# Patient Record
Sex: Female | Born: 1950 | Race: Black or African American | Hispanic: No | Marital: Single
Health system: Southern US, Community
[De-identification: ages and names within clinical notes are randomized; demographics above are authoritative.]

---

## 2014-12-20 ENCOUNTER — Other Ambulatory Visit (HOSPITAL_COMMUNITY): Payer: Self-pay

## 2014-12-20 ENCOUNTER — Inpatient Hospital Stay
Admission: AD | Admit: 2014-12-20 | Discharge: 2015-01-27 | Disposition: E | Payer: Self-pay | Source: Ambulatory Visit | Attending: Internal Medicine | Admitting: Internal Medicine

## 2014-12-20 DIAGNOSIS — J189 Pneumonia, unspecified organism: Secondary | ICD-10-CM

## 2014-12-20 DIAGNOSIS — Z4659 Encounter for fitting and adjustment of other gastrointestinal appliance and device: Secondary | ICD-10-CM

## 2014-12-20 DIAGNOSIS — K922 Gastrointestinal hemorrhage, unspecified: Secondary | ICD-10-CM

## 2014-12-20 DIAGNOSIS — R609 Edema, unspecified: Secondary | ICD-10-CM

## 2014-12-20 DIAGNOSIS — I509 Heart failure, unspecified: Secondary | ICD-10-CM

## 2014-12-20 DIAGNOSIS — N179 Acute kidney failure, unspecified: Secondary | ICD-10-CM

## 2014-12-20 DIAGNOSIS — N170 Acute kidney failure with tubular necrosis: Secondary | ICD-10-CM

## 2014-12-20 DIAGNOSIS — A419 Sepsis, unspecified organism: Secondary | ICD-10-CM

## 2014-12-21 LAB — CBC WITH DIFFERENTIAL/PLATELET
BASOS ABS: 0 10*3/uL (ref 0.0–0.1)
BASOS PCT: 0 %
EOS PCT: 0 %
Eosinophils Absolute: 0 10*3/uL (ref 0.0–0.7)
HEMATOCRIT: 27.4 % — AB (ref 36.0–46.0)
Hemoglobin: 9.7 g/dL — ABNORMAL LOW (ref 12.0–15.0)
Lymphocytes Relative: 25 %
Lymphs Abs: 1.5 10*3/uL (ref 0.7–4.0)
MCH: 31.1 pg (ref 26.0–34.0)
MCHC: 35.4 g/dL (ref 30.0–36.0)
MCV: 87.8 fL (ref 78.0–100.0)
MONO ABS: 0.3 10*3/uL (ref 0.1–1.0)
MONOS PCT: 4 %
NEUTROS ABS: 4.4 10*3/uL (ref 1.7–7.7)
Neutrophils Relative %: 71 %
PLATELETS: 34 10*3/uL — AB (ref 150–400)
RBC: 3.12 MIL/uL — ABNORMAL LOW (ref 3.87–5.11)
RDW: 18 % — AB (ref 11.5–15.5)
WBC: 6.2 10*3/uL (ref 4.0–10.5)

## 2014-12-21 LAB — COMPREHENSIVE METABOLIC PANEL
ALBUMIN: 2.7 g/dL — AB (ref 3.5–5.0)
ALK PHOS: 61 U/L (ref 38–126)
ALT: 40 U/L (ref 14–54)
ANION GAP: 5 (ref 5–15)
AST: 60 U/L — ABNORMAL HIGH (ref 15–41)
BUN: 126 mg/dL — ABNORMAL HIGH (ref 6–20)
CALCIUM: 8.3 mg/dL — AB (ref 8.9–10.3)
CO2: 25 mmol/L (ref 22–32)
Chloride: 107 mmol/L (ref 101–111)
Creatinine, Ser: 2.74 mg/dL — ABNORMAL HIGH (ref 0.44–1.00)
GFR calc Af Amer: 20 mL/min — ABNORMAL LOW (ref >60)
GFR calc non Af Amer: 17 mL/min — ABNORMAL LOW (ref >60)
GLUCOSE: 90 mg/dL (ref 65–99)
POTASSIUM: 4.6 mmol/L (ref 3.5–5.1)
Sodium: 137 mmol/L (ref 135–145)
Total Bilirubin: 2.3 mg/dL — ABNORMAL HIGH (ref 0.3–1.2)
Total Protein: 4.9 g/dL — ABNORMAL LOW (ref 6.5–8.1)

## 2014-12-21 LAB — URINALYSIS, ROUTINE W REFLEX MICROSCOPIC
BILIRUBIN URINE: NEGATIVE
GLUCOSE, UA: NEGATIVE mg/dL
Ketones, ur: 15 mg/dL — AB
Nitrite: NEGATIVE
Protein, ur: >300 mg/dL — AB
SPECIFIC GRAVITY, URINE: 1.019 (ref 1.005–1.030)
UROBILINOGEN UA: 0.2 mg/dL (ref 0.0–1.0)
pH: 5 (ref 5.0–8.0)

## 2014-12-21 LAB — URINE MICROSCOPIC-ADD ON

## 2014-12-21 LAB — TSH: TSH: 2.885 u[IU]/mL (ref 0.350–4.500)

## 2014-12-21 LAB — PROTIME-INR
INR: 1.87 — AB (ref 0.00–1.49)
PROTHROMBIN TIME: 21.4 s — AB (ref 11.6–15.2)

## 2014-12-21 LAB — T4, FREE: FREE T4: 1.28 ng/dL — AB (ref 0.61–1.12)

## 2014-12-21 LAB — VANCOMYCIN, TROUGH: VANCOMYCIN TR: 26 ug/mL — AB (ref 10.0–20.0)

## 2014-12-21 LAB — D-DIMER, QUANTITATIVE (NOT AT ARMC): D DIMER QUANT: 13.28 ug{FEU}/mL — AB (ref 0.00–0.48)

## 2014-12-21 LAB — PHOSPHORUS: Phosphorus: 4.8 mg/dL — ABNORMAL HIGH (ref 2.5–4.6)

## 2014-12-21 LAB — MAGNESIUM: Magnesium: 2.4 mg/dL (ref 1.7–2.4)

## 2014-12-21 LAB — BRAIN NATRIURETIC PEPTIDE: B Natriuretic Peptide: 525.9 pg/mL — ABNORMAL HIGH (ref 0.0–100.0)

## 2014-12-22 LAB — BASIC METABOLIC PANEL
Anion gap: 10 (ref 5–15)
BUN: 136 mg/dL — AB (ref 6–20)
CHLORIDE: 101 mmol/L (ref 101–111)
CO2: 25 mmol/L (ref 22–32)
CREATININE: 3.26 mg/dL — AB (ref 0.44–1.00)
Calcium: 8.1 mg/dL — ABNORMAL LOW (ref 8.9–10.3)
GFR calc non Af Amer: 14 mL/min — ABNORMAL LOW (ref >60)
GFR, EST AFRICAN AMERICAN: 16 mL/min — AB (ref >60)
Glucose, Bld: 173 mg/dL — ABNORMAL HIGH (ref 65–99)
POTASSIUM: 4.8 mmol/L (ref 3.5–5.1)
SODIUM: 136 mmol/L (ref 135–145)

## 2014-12-22 LAB — CBC WITH DIFFERENTIAL/PLATELET
BASOS PCT: 0 %
Basophils Absolute: 0 10*3/uL (ref 0.0–0.1)
EOS PCT: 0 %
Eosinophils Absolute: 0 10*3/uL (ref 0.0–0.7)
HEMATOCRIT: 26 % — AB (ref 36.0–46.0)
HEMOGLOBIN: 9 g/dL — AB (ref 12.0–15.0)
LYMPHS PCT: 27 %
Lymphs Abs: 1.3 10*3/uL (ref 0.7–4.0)
MCH: 31.6 pg (ref 26.0–34.0)
MCHC: 34.6 g/dL (ref 30.0–36.0)
MCV: 91.2 fL (ref 78.0–100.0)
MONO ABS: 0.2 10*3/uL (ref 0.1–1.0)
Monocytes Relative: 4 %
NEUTROS PCT: 69 %
Neutro Abs: 3.4 10*3/uL (ref 1.7–7.7)
Platelets: 27 10*3/uL — CL (ref 150–400)
RBC: 2.85 MIL/uL — AB (ref 3.87–5.11)
RDW: 18.3 % — ABNORMAL HIGH (ref 11.5–15.5)
WBC: 4.9 10*3/uL (ref 4.0–10.5)

## 2014-12-22 LAB — URINE CULTURE: CULTURE: NO GROWTH

## 2014-12-22 LAB — VANCOMYCIN, TROUGH: Vancomycin Tr: 19 ug/mL (ref 10.0–20.0)

## 2014-12-22 LAB — HEMOGLOBIN A1C
HEMOGLOBIN A1C: 8.3 % — AB (ref 4.8–5.6)
MEAN PLASMA GLUCOSE: 192 mg/dL

## 2014-12-22 LAB — MAGNESIUM: MAGNESIUM: 2.6 mg/dL — AB (ref 1.7–2.4)

## 2014-12-22 LAB — PHOSPHORUS: PHOSPHORUS: 6.3 mg/dL — AB (ref 2.5–4.6)

## 2014-12-22 LAB — PROCALCITONIN: Procalcitonin: 0.49 ng/mL

## 2014-12-22 NOTE — Consult Note (Signed)
Date: 12/22/2014                  Patient Name:  Kristina Moon  MRN: [id number]  DOB: 17-Jul-1950  Age / Sex: 64 y.o., female         PCP: No primary care provider on file.                 [facility]ing [facility]                 Reason for Consult: Acute renal failure            History of Present Illness: Patient is a 64 y.o. 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  Patient was admitted to [facility] emergency room on October 13 after she was found to have hypoglycemia midmorning. Her blood sugar was 23. Oral intake in the last few days had been poor. She was found to have low-grade fever of 100.2. Workup suggested that she has UTI therefore she was admitted At the day of admission, her creatinine was 2.27, BUN 110, glucose 343, albumin 3.2, potassium 4.4 UGI from the Escherichia coli was treated with Levaquin. She had staph epidermidis bacteremia 4 out of 4 bottles. It was sensitive to Levaquin. Her discharge summary, she had a CT without contrast of the chest which showed developing right lower lobe pneumonia so vancomycin and Zosyn was started for hospital-acquired pneumonia. VQ scan showed low probability of PE. Lower extremity Dopplers were negative for DVT.  She was seen by nephrology service and was started on Lasix Ticlid 200 cardiorenal syndrome and congestive heart failure. Cirrhosis is considered to be secondary to [place].   Medications: Outpatient medications: No prescriptions prior to admission    Current medications: No current facility-administered  medications for this encounter.   Zosyn 2.25 g IV every 6 hours Vancomycin 500 mg IV daily Aspirin 81 mg daily Lipitor 20 mg each bedtime Carvedilol 25 mg twice a day Plavix 75 mg daily Pepcid 20 mg twice a day Iron sulfate 325 mg twice a day Hydralazine 10 mg twice a day Isosorbide nitrate 10 mg twice a day Lantus 65 mg twice a day Levofloxacin 250 mg every 48 hours Prednisone 60 mg daily Probiotic Vitamin B12 Vitamin D 3 2000 daily   Allergies: Allergies not on file  propoxyphene  Past Medical History: No past medical history on file. As noted  Past Surgical History: No past surgical history on file.   Family History: No family history on file. Colon cancer in aunt Congestive heart failure and coronary disease in mother  Social History: Social History   Social History  . Marital Status: Single    Spouse Name: N/A  . Number of Children: N/A  . Years of Education: N/A   Occupational History  . Not on file.   Social History Main Topics  . Smoking status: Not on file  . Smokeless tobacco: Not on file  . Alcohol Use: Not on file  . Drug Use: Not on file  . Sexual Activity: Not on file   Other Topics Concern  . Not on file   Social History Narrative  . No narrative on file  Review of Systems: Gen: generalized weakness HEENT: no complaints CV: history of congestive heart failure, currently has significant edema Resp: denies any complaint ZO:XWRUEAVWGI:appetite is good GU : currently has a cold MS: she is able to walk at home Derm:  no complaints Psych:normal Heme: takes prednisone for autoimmune hemolytic anemia Neuro: no complaints Endocrine: diabetic kidney disease  Vital Signs: There were no vitals taken for this visit.  No intake or output data in the 24 hours ending 12/22/14 1400  Weight trends: There were no vitals filed for this visit.  Physical Exam: General:  obese lady, laying in the bed, in no distress  HEENT Anicteric, moist  mucous membranes, pale conjunctiva  Neck:  supple, no masses  Lungs: Normal respiratory effort, clear to auscultation  Heart::  regular rhythm, no rubs or gallops  Abdomen: Soft, nontender, nondistended  Extremities:  massive lower extremity edema upto lower abdomen  Neurologic: , oriented, alert, speech a little hard to understand  Skin: Edema blisters  Access:   Foley: present       Lab results: Basic Metabolic Panel:  Recent Labs Lab 12/21/14 0800 12/22/14 0550  NA 137 136  K 4.6 4.8  CL 107 101  CO2 25 25  GLUCOSE 90 173*  BUN 126* 136*  CREATININE 2.74* 3.26*  CALCIUM 8.3* 8.1*  MG 2.4 2.6*  PHOS 4.8* 6.3*    Liver Function Tests:  Recent Labs Lab 12/21/14 0800  AST 60*  ALT 40  ALKPHOS 61  BILITOT 2.3*  PROT 4.9*  ALBUMIN 2.7*   No results for input(s): LIPASE, AMYLASE in the last 168 hours. No results for input(s): AMMONIA in the last 168 hours.  CBC:  Recent Labs Lab 12/21/14 0800 12/22/14 0550  WBC 6.2 4.9  NEUTROABS 4.4 3.4  HGB 9.7* 9.0*  HCT 27.4* 26.0*  MCV 87.8 91.2  PLT 34* 27*    Cardiac Enzymes: No results for input(s): CKTOTAL, TROPONINI in the last 168 hours.  BNP: Invalid input(s): POCBNP  CBG: No results for input(s): GLUCAP in the last 168 hours.  Microbiology: Recent Results (from the past 720 hour(s))  Culture, Urine     Status: None   Collection Time: 12/21/14  8:08 AM  Result Value Ref Range Status   Specimen Description URINE, RANDOM  Final   Special Requests NONE  Final   Culture NO GROWTH 1 DAY  Final   Report Status 12/22/2014 FINAL  Final     Coagulation Studies:  Recent Labs  12/21/14 0800  LABPROT 21.4*  INR 1.87*    Urinalysis:  Recent Labs  12/21/14 0808  COLORURINE YELLOW  LABSPEC 1.019  PHURINE 5.0  GLUCOSEU NEGATIVE  HGBUR LARGE*  BILIRUBINUR NEGATIVE  KETONESUR 15*  PROTEINUR >300*  UROBILINOGEN 0.2  NITRITE NEGATIVE  LEUKOCYTESUR MODERATE*      Imaging: Dg Chest Port 1  View  12/17/2014  CLINICAL DATA:  Pneumonia EXAM: PORTABLE CHEST 1 VIEW COMPARISON:  None. FINDINGS: Borderline cardiomegaly. No acute infiltrate or pleural effusion. No pulmonary edema. Right arm PICC line with tip in distal SVC. IMPRESSION: No active disease. Electronically Signed   By: Natasha MeadLiviu  Pop M.D.   On: 12/13/2014 20:05      Assessment & Plan: Patient is a 64 y.o. 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   1. Acute renal failure, likely ATN secondary to concurrent infection. Baseline Creatinine 0.64 in May of 2016 according to care everywhere 2. Diabetes with complications, Nephropathy. Poorly controlled. Hemoglobin A1c 8.5% in May.  3. Chronic systolic congestive heart failure with acute exacerbation 4. 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 5. Hemolytic anemia- requiring use of prednisone. Not clear when it was started 6. Hematuria and proteinuria 7. Urinary tract infection 8. Lower extremity edema   Patient has worsening edema and fluid retention likely secondary to chronic use of prednisone. She also has underlying liver cirrhosis. Her BUN and creatinine are getting worse. BUN is high likely secondary to again use of prednisone Patient is also noted to be Plavix. She is not clear about the reason that she has to take Plavix. She does not have any cardiac stents etc.    Plan: We will start her on low dose Lasix drip along with IV albumin supplementation Consider decreasing prednisone Reevaluate necessity of using Plavix as patient may require temporary dialysis catheter placement in the future if she does not  respond to medical treatment Patient will need renal U/S

## 2014-12-23 ENCOUNTER — Other Ambulatory Visit (HOSPITAL_COMMUNITY): Payer: Self-pay

## 2014-12-23 LAB — CBC WITH DIFFERENTIAL/PLATELET
BASOS ABS: 0 10*3/uL (ref 0.0–0.1)
Basophils Relative: 0 %
EOS PCT: 0 %
Eosinophils Absolute: 0 10*3/uL (ref 0.0–0.7)
HEMATOCRIT: 26.6 % — AB (ref 36.0–46.0)
Hemoglobin: 9.3 g/dL — ABNORMAL LOW (ref 12.0–15.0)
LYMPHS ABS: 1.5 10*3/uL (ref 0.7–4.0)
LYMPHS PCT: 22 %
MCH: 31.7 pg (ref 26.0–34.0)
MCHC: 35 g/dL (ref 30.0–36.0)
MCV: 90.8 fL (ref 78.0–100.0)
MONO ABS: 0.3 10*3/uL (ref 0.1–1.0)
Monocytes Relative: 4 %
NEUTROS ABS: 5.1 10*3/uL (ref 1.7–7.7)
Neutrophils Relative %: 74 %
PLATELETS: 32 10*3/uL — AB (ref 150–400)
RBC: 2.93 MIL/uL — AB (ref 3.87–5.11)
RDW: 18.2 % — ABNORMAL HIGH (ref 11.5–15.5)
WBC: 6.8 10*3/uL (ref 4.0–10.5)

## 2014-12-23 LAB — MAGNESIUM: MAGNESIUM: 2.7 mg/dL — AB (ref 1.7–2.4)

## 2014-12-23 LAB — RENAL FUNCTION PANEL
Albumin: 2.7 g/dL — ABNORMAL LOW (ref 3.5–5.0)
Anion gap: 10 (ref 5–15)
BUN: 148 mg/dL — ABNORMAL HIGH (ref 6–20)
CHLORIDE: 102 mmol/L (ref 101–111)
CO2: 22 mmol/L (ref 22–32)
Calcium: 8.4 mg/dL — ABNORMAL LOW (ref 8.9–10.3)
Creatinine, Ser: 3.72 mg/dL — ABNORMAL HIGH (ref 0.44–1.00)
GFR, EST AFRICAN AMERICAN: 14 mL/min — AB (ref >60)
GFR, EST NON AFRICAN AMERICAN: 12 mL/min — AB (ref >60)
Glucose, Bld: 69 mg/dL (ref 65–99)
PHOSPHORUS: 6.5 mg/dL — AB (ref 2.5–4.6)
POTASSIUM: 4.8 mmol/L (ref 3.5–5.1)
Sodium: 134 mmol/L — ABNORMAL LOW (ref 135–145)

## 2014-12-23 LAB — AMMONIA: AMMONIA: 53 umol/L — AB (ref 9–35)

## 2014-12-24 LAB — MAGNESIUM: MAGNESIUM: 2.7 mg/dL — AB (ref 1.7–2.4)

## 2014-12-24 LAB — PROCALCITONIN: PROCALCITONIN: 0.41 ng/mL

## 2014-12-24 NOTE — Progress Notes (Signed)
Subjective:  Patient sitting up eating breakfast Currently on lasix drip UOP 1220 cc last 24 hrs   Objective:  Vital signs in last 24 hours:   T 97.6  P 67  R 18  BP 131/69  Weight change:  There were no vitals filed for this visit.  Intake/Output:   No intake or output data in the 24 hours ending 12/24/14 0902   Physical Exam: General: NAD, sitting in bed, eating breakfast  HEENT Anicteric, moist oral mucus membranes  Neck supple  Pulm/lungs Clear ant, lat, Normal effort, room air  CVS/Heart Regular, no rub  Abdomen:  Soft, non tender, non distended  Extremities: +++ dependent edema  Neurologic: Alert, oreinted  Skin: Normal turgor  Access:    Foley    Basic Metabolic Panel:   Recent Labs Lab 12/21/14 0800 12/22/14 0550 12/23/14 0513 12/24/14 0627  NA 137 136 134*  --   K 4.6 4.8 4.8  --   CL 107 101 102  --   CO2 --   GLUCOSE 90 173* 69  --   BUN 126* 136* 148*  --   CREATININE 2.74* 3.26* 3.72*  --   CALCIUM 8.3* 8.1* 8.4*  --   MG 2.4 2.6* 2.7* 2.7*  PHOS 4.8* 6.3* 6.5*  --      CBC:  Recent Labs Lab 12/21/14 0800 12/22/14 0550 12/23/14 0513  WBC 6.2 4.9 6.8  NEUTROABS 4.4 3.4 5.1  HGB 9.7* 9.0* 9.3*  HCT 27.4* 26.0* 26.6*  MCV 87.8 91.2 90.8  PLT 34* 27* 32*      Microbiology:  Recent Results (from the past 720 hour(s))  Culture, Urine     Status: None   Collection Time: 12/21/14  8:08 AM  Result Value Ref Range Status   Specimen Description URINE, RANDOM  Final   Special Requests NONE  Final   Culture NO GROWTH 1 DAY  Final   Report Status 12/22/2014 FINAL  Final    Coagulation Studies: No results for input(s): LABPROT, INR in the last 72 hours.  Urinalysis: No results for input(s): COLORURINE, LABSPEC, PHURINE, GLUCOSEU, HGBUR, BILIRUBINUR, KETONESUR, PROTEINUR, UROBILINOGEN, NITRITE, LEUKOCYTESUR in the last 72 hours.  Invalid input(s): APPERANCEUR    Imaging: US Renal Port  12/23/2014  CLINICAL DATA:   Acute tubular necrosis. Elevated BUN and creatinine. EXAM: RENAL / URINARY TRACT ULTRASOUND COMPLETE COMPARISON:  None. FINDINGS: Right Kidney: Length: 10.8 cm. Echogenic renal parenchyma. No focal mass or hydronephrosis. Left Kidney: Length: 10.6 cm. Echogenic renal parenchyma. No focal mass or hydronephrosis. Bladder: Patient has a Foley catheter.  The bladder is not visualized. IMPRESSION: 1. Echogenic renal parenchyma bilaterally. 2. No focal mass or hydronephrosis. Electronically Signed   By: Norva Pavlov M.D.   On: 12/23/2014 15:08     Medications:       Assessment/ Plan:  64 y.o. 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   1. Acute renal failure, likely ATN secondary to concurrent infection. Baseline Creatinine 0.64 in May of 2016 according to care everywhere recs 2. Diabetes with complications, Nephropathy. Poorly controlled. Hemoglobin A1c 8.5% in May.  3. Chronic systolic congestive heart failure with acute exacerbation 4. Cirrhosis of the  liver not due to alcohol. 5. Hemolytic anemia- requiring use of prednisone. Not clear when it was started 6. Hematuria and proteinuria 7. Urinary tract infection 8. Anasarca/Generalized edema 9. Thrombocytopenia   Patient has worsening edema and fluid retention likely secondary to chronic use of prednisone. She also has underlying liver cirrhosis. Her BUN and creatinine are getting worse. BUN is high likely secondary to again use of prednisone Patient is also noted to be Plavix. She is not clear about the reason that she has to take Plavix. She does not have any cardiac  stents etc.    Plan: Continue low dose Lasix drip along with IV albumin supplementation Consider decreasing prednisone Reevaluate necessity of using Plavix as patient may require temporary dialysis catheter placement in the future if she does not respond to medical treatment     LOS:  Kristina Moon 10/28/20169:02 AM

## 2014-12-25 LAB — CBC WITH DIFFERENTIAL/PLATELET
Basophils Absolute: 0 10*3/uL (ref 0.0–0.1)
Basophils Relative: 0 %
EOS PCT: 0 %
Eosinophils Absolute: 0 10*3/uL (ref 0.0–0.7)
HCT: 19.8 % — ABNORMAL LOW (ref 36.0–46.0)
Hemoglobin: 6.7 g/dL — CL (ref 12.0–15.0)
LYMPHS ABS: 1.7 10*3/uL (ref 0.7–4.0)
Lymphocytes Relative: 22 %
MCH: 31 pg (ref 26.0–34.0)
MCHC: 33.8 g/dL (ref 30.0–36.0)
MCV: 91.7 fL (ref 78.0–100.0)
MONOS PCT: 3 %
Monocytes Absolute: 0.2 10*3/uL (ref 0.1–1.0)
NEUTROS ABS: 5.6 10*3/uL (ref 1.7–7.7)
Neutrophils Relative %: 75 %
PLATELETS: 33 10*3/uL — AB (ref 150–400)
RBC: 2.16 MIL/uL — AB (ref 3.87–5.11)
RDW: 19.1 % — AB (ref 11.5–15.5)
WBC: 7.5 10*3/uL (ref 4.0–10.5)

## 2014-12-25 LAB — RENAL FUNCTION PANEL
Albumin: 2.2 g/dL — ABNORMAL LOW (ref 3.5–5.0)
Anion gap: 12 (ref 5–15)
BUN: 188 mg/dL — ABNORMAL HIGH (ref 6–20)
CHLORIDE: 105 mmol/L (ref 101–111)
CO2: 21 mmol/L — AB (ref 22–32)
CREATININE: 4.44 mg/dL — AB (ref 0.44–1.00)
Calcium: 8.1 mg/dL — ABNORMAL LOW (ref 8.9–10.3)
GFR calc non Af Amer: 10 mL/min — ABNORMAL LOW (ref >60)
GFR, EST AFRICAN AMERICAN: 11 mL/min — AB (ref >60)
Glucose, Bld: 66 mg/dL (ref 65–99)
POTASSIUM: 5.4 mmol/L — AB (ref 3.5–5.1)
Phosphorus: 7.9 mg/dL — ABNORMAL HIGH (ref 2.5–4.6)
Sodium: 138 mmol/L (ref 135–145)

## 2014-12-25 LAB — HEMOGLOBIN AND HEMATOCRIT, BLOOD
HEMATOCRIT: 19.3 % — AB (ref 36.0–46.0)
Hemoglobin: 6.6 g/dL — CL (ref 12.0–15.0)

## 2014-12-25 LAB — PREPARE RBC (CROSSMATCH)

## 2014-12-25 LAB — ABO/RH: ABO/RH(D): O POS

## 2014-12-26 ENCOUNTER — Other Ambulatory Visit (HOSPITAL_COMMUNITY): Payer: Self-pay

## 2014-12-26 LAB — URINE MICROSCOPIC-ADD ON

## 2014-12-26 LAB — BLOOD GAS, ARTERIAL
ACID-BASE DEFICIT: 10.6 mmol/L — AB (ref 0.0–2.0)
Bicarbonate: 13.8 mEq/L — ABNORMAL LOW (ref 20.0–24.0)
FIO2: 0.21
O2 SAT: 98.4 %
PATIENT TEMPERATURE: 98.6
PCO2 ART: 25.1 mmHg — AB (ref 35.0–45.0)
TCO2: 14.5 mmol/L (ref 0–100)
pH, Arterial: 7.358 (ref 7.350–7.450)
pO2, Arterial: 113 mmHg — ABNORMAL HIGH (ref 80.0–100.0)

## 2014-12-26 LAB — RENAL FUNCTION PANEL
ALBUMIN: 2 g/dL — AB (ref 3.5–5.0)
Anion gap: 18 — ABNORMAL HIGH (ref 5–15)
BUN: 205 mg/dL — AB (ref 6–20)
CALCIUM: 8.5 mg/dL — AB (ref 8.9–10.3)
CHLORIDE: 110 mmol/L (ref 101–111)
CO2: 16 mmol/L — ABNORMAL LOW (ref 22–32)
Creatinine, Ser: 5.74 mg/dL — ABNORMAL HIGH (ref 0.44–1.00)
GFR, EST AFRICAN AMERICAN: 8 mL/min — AB (ref >60)
GFR, EST NON AFRICAN AMERICAN: 7 mL/min — AB (ref >60)
Glucose, Bld: 110 mg/dL — ABNORMAL HIGH (ref 65–99)
PHOSPHORUS: 10.2 mg/dL — AB (ref 2.5–4.6)
POTASSIUM: 6.2 mmol/L — AB (ref 3.5–5.1)
Sodium: 144 mmol/L (ref 135–145)

## 2014-12-26 LAB — URINALYSIS, ROUTINE W REFLEX MICROSCOPIC
Bilirubin Urine: NEGATIVE
GLUCOSE, UA: NEGATIVE mg/dL
Ketones, ur: 15 mg/dL — AB
NITRITE: NEGATIVE
PH: 5 (ref 5.0–8.0)
Protein, ur: 100 mg/dL — AB
SPECIFIC GRAVITY, URINE: 1.021 (ref 1.005–1.030)
Urobilinogen, UA: 0.2 mg/dL (ref 0.0–1.0)

## 2014-12-26 LAB — CK TOTAL AND CKMB (NOT AT ARMC)
CK TOTAL: 431 U/L — AB (ref 38–234)
CK, MB: 29.4 ng/mL — ABNORMAL HIGH (ref 0.5–5.0)
CK, MB: 28.4 ng/mL — AB (ref 0.5–5.0)
RELATIVE INDEX: 6.6 — AB (ref 0.0–2.5)
Relative Index: 6.8 — ABNORMAL HIGH (ref 0.0–2.5)
Total CK: 431 U/L — ABNORMAL HIGH (ref 38–234)

## 2014-12-26 LAB — CBC
HCT: 20.9 % — ABNORMAL LOW (ref 36.0–46.0)
Hemoglobin: 7 g/dL — ABNORMAL LOW (ref 12.0–15.0)
MCH: 30.2 pg (ref 26.0–34.0)
MCHC: 33.5 g/dL (ref 30.0–36.0)
MCV: 90.1 fL (ref 78.0–100.0)
PLATELETS: 36 10*3/uL — AB (ref 150–400)
RBC: 2.32 MIL/uL — AB (ref 3.87–5.11)
RDW: 17.8 % — AB (ref 11.5–15.5)
WBC: 7.9 10*3/uL (ref 4.0–10.5)

## 2014-12-26 LAB — LACTIC ACID, PLASMA: LACTIC ACID, VENOUS: 5.3 mmol/L — AB (ref 0.5–2.0)

## 2014-12-26 LAB — IRON AND TIBC
IRON: 93 ug/dL (ref 28–170)
Saturation Ratios: 91 % — ABNORMAL HIGH (ref 10.4–31.8)
TIBC: 102 ug/dL — ABNORMAL LOW (ref 250–450)
UIBC: 9 ug/dL

## 2014-12-26 LAB — TROPONIN I: Troponin I: 0.71 ng/mL (ref <0.031)

## 2014-12-26 LAB — FERRITIN: Ferritin: 617 ng/mL — ABNORMAL HIGH (ref 11–307)

## 2014-12-26 LAB — OCCULT BLOOD X 1 CARD TO LAB, STOOL: Fecal Occult Bld: POSITIVE — AB

## 2014-12-27 ENCOUNTER — Other Ambulatory Visit (HOSPITAL_COMMUNITY): Payer: Self-pay

## 2014-12-27 LAB — URINE CULTURE: CULTURE: NO GROWTH

## 2014-12-27 LAB — RENAL FUNCTION PANEL
ANION GAP: 20 — AB (ref 5–15)
Albumin: 2.3 g/dL — ABNORMAL LOW (ref 3.5–5.0)
BUN: 219 mg/dL — ABNORMAL HIGH (ref 6–20)
CHLORIDE: 110 mmol/L (ref 101–111)
CO2: 15 mmol/L — AB (ref 22–32)
Calcium: 8.7 mg/dL — ABNORMAL LOW (ref 8.9–10.3)
Creatinine, Ser: 6.23 mg/dL — ABNORMAL HIGH (ref 0.44–1.00)
GFR calc non Af Amer: 6 mL/min — ABNORMAL LOW (ref >60)
GFR, EST AFRICAN AMERICAN: 8 mL/min — AB (ref >60)
GLUCOSE: 121 mg/dL — AB (ref 65–99)
POTASSIUM: 6.1 mmol/L — AB (ref 3.5–5.1)
Phosphorus: 10.9 mg/dL — ABNORMAL HIGH (ref 2.5–4.6)
SODIUM: 145 mmol/L (ref 135–145)

## 2014-12-27 LAB — CBC WITH DIFFERENTIAL/PLATELET
BASOS PCT: 0 %
Basophils Absolute: 0 10*3/uL (ref 0.0–0.1)
EOS ABS: 0 10*3/uL (ref 0.0–0.7)
Eosinophils Relative: 0 %
HCT: 19.5 % — ABNORMAL LOW (ref 36.0–46.0)
Hemoglobin: 6.6 g/dL — CL (ref 12.0–15.0)
LYMPHS ABS: 1.4 10*3/uL (ref 0.7–4.0)
Lymphocytes Relative: 16 %
MCH: 30 pg (ref 26.0–34.0)
MCHC: 33.8 g/dL (ref 30.0–36.0)
MCV: 88.6 fL (ref 78.0–100.0)
MONO ABS: 0.3 10*3/uL (ref 0.1–1.0)
Monocytes Relative: 3 %
NEUTROS PCT: 81 %
Neutro Abs: 7 10*3/uL (ref 1.7–7.7)
PLATELETS: 31 10*3/uL — AB (ref 150–400)
RBC: 2.2 MIL/uL — ABNORMAL LOW (ref 3.87–5.11)
RDW: 17.8 % — AB (ref 11.5–15.5)
WBC: 8.7 10*3/uL (ref 4.0–10.5)

## 2014-12-27 LAB — CK TOTAL AND CKMB (NOT AT ARMC)
CK TOTAL: 424 U/L — AB (ref 38–234)
CK, MB: 34.2 ng/mL — AB (ref 0.5–5.0)
CK, MB: 35.1 ng/mL — AB (ref 0.5–5.0)
RELATIVE INDEX: 8.1 — AB (ref 0.0–2.5)
Relative Index: 8.1 — ABNORMAL HIGH (ref 0.0–2.5)
Total CK: 436 U/L — ABNORMAL HIGH (ref 38–234)

## 2014-12-27 LAB — BRAIN NATRIURETIC PEPTIDE: B Natriuretic Peptide: 118.5 pg/mL — ABNORMAL HIGH (ref 0.0–100.0)

## 2014-12-27 LAB — PROTIME-INR
INR: 2.55 — ABNORMAL HIGH (ref 0.00–1.49)
PROTHROMBIN TIME: 27.1 s — AB (ref 11.6–15.2)

## 2014-12-27 MED ORDER — HEPARIN SODIUM (PORCINE) 1000 UNIT/ML IJ SOLN
INTRAMUSCULAR | Status: AC
  Filled 2014-12-27: qty 1

## 2014-12-27 MED ORDER — LIDOCAINE HCL 1 % IJ SOLN
INTRAMUSCULAR | Status: AC
  Filled 2014-12-27: qty 20

## 2014-12-27 NOTE — Progress Notes (Signed)
Subjective:  Patient just had temp R IJ dialysis catheter placed. Still under the effects of sedation. Due for dialysis  Later today. Renal function continues to deteriorate. K up to 6.1 as well.   Objective:  Vital signs in last 24 hours:  Temperature 90.4 pulse 60 respirations 16 blood pressure 96/50  Weight change:  There were no vitals filed for this visit.  Intake/Output:   No intake or output data in the 24 hours ending 12/27/14 1632   Physical Exam: General: Under the effects of sedation  HEENT Anicteric, moist oral mucus membranes  Neck supple  Pulm/lungs Basilar rales, normal effort  CVS/Heart Regular, no rub  Abdomen:  Soft, non tender, non distended  Extremities: +++ dependent edema  Neurologic: Under the effects of sedation post catheter placement  Skin: Normal turgor  Access: Right internal jugular temporary dialysis catheter   Foley    Basic Metabolic Panel:   Recent Labs Lab 12/21/14 0800 12/22/14 0550 12/23/14 0513 12/24/14 0627 12/25/14 0650 12/26/14 1437 12/27/14 0440  NA 137 136 134*  --  138 144 145  K 4.6 4.8 4.8  --  5.4* 6.2* 6.1*  CL 107 101 102  --  105 110 110  CO2 --  21* 16* 15*  GLUCOSE 90 173* 69  --  66 110* 121*  BUN 126* 136* 148*  --  188* 205* 219*  CREATININE 2.74* 3.26* 3.72*  --  4.44* 5.74* 6.23*  CALCIUM 8.3* 8.1* 8.4*  --  8.1* 8.5* 8.7*  MG 2.4 2.6* 2.7* 2.7*  --   --   --   PHOS 4.8* 6.3* 6.5*  --  7.9* 10.2* 10.9*     CBC:  Recent Labs Lab 12/21/14 0800 12/22/14 0550 12/23/14 0513 12/25/14 0650 12/25/14 1114 12/26/14 1359 12/27/14 0440  WBC 6.2 4.9 6.8 7.5  --  7.9 8.7  NEUTROABS 4.4 3.4 5.1 5.6  --   --  7.0  HGB 9.7* 9.0* 9.3* 6.7* 6.6* 7.0* 6.6*  HCT 27.4* 26.0* 26.6* 19.8* 19.3* 20.9* 19.5*  MCV 87.8 91.2 90.8 91.7  --  90.1 88.6  PLT 34* 27* 32* 33*  --  36* 31*      Microbiology:  Recent Results (from the past 720 hour(s))  Culture, Urine     Status: None   Collection  Time: 12/21/14  8:08 AM  Result Value Ref Range Status   Specimen Description URINE, RANDOM  Final   Special Requests NONE  Final   Culture NO GROWTH 1 DAY  Final   Report Status 12/22/2014 FINAL  Final  Culture, blood (routine x 2)     Status: None (Preliminary result)   Collection Time: 12/24/14  5:55 PM  Result Value Ref Range Status   Specimen Description BLOOD PICC LINE  Final   Special Requests BOTTLES DRAWN AEROBIC AND ANAEROBIC 5CC  Final   Culture NO GROWTH 3 DAYS  Final   Report Status PENDING  Incomplete  Culture, blood (routine x 2)     Status: None (Preliminary result)   Collection Time: 12/24/14  9:07 PM  Result Value Ref Range Status   Specimen Description BLOOD RIGHT WRIST  Final   Special Requests BOTTLES DRAWN AEROBIC AND ANAEROBIC 10CC  Final   Culture NO GROWTH 3 DAYS  Final   Report Status PENDING  Incomplete  Culture, Urine     Status: None   Collection Time: 12/26/14  2:55 PM  Result Value Ref Range Status  Specimen Description URINE, RANDOM  Final   Special Requests NONE  Final   Culture NO GROWTH 1 DAY  Final   Report Status 12/27/2014 FINAL  Final  Culture, blood (routine x 2)     Status: None (Preliminary result)   Collection Time: 12/26/14  3:12 PM  Result Value Ref Range Status   Specimen Description BLOOD LEFT ANTECUBITAL  Final   Special Requests BOTTLES DRAWN AEROBIC ONLY 9CC  Final   Culture NO GROWTH < 24 HOURS  Final   Report Status PENDING  Incomplete  Culture, blood (routine x 2)     Status: None (Preliminary result)   Collection Time: 12/26/14  3:21 PM  Result Value Ref Range Status   Specimen Description BLOOD LEFT HAND  Final   Special Requests IN PEDIATRIC BOTTLE 1CC  Final   Culture NO GROWTH < 24 HOURS  Final   Report Status PENDING  Incomplete    Coagulation Studies:  Recent Labs  12/27/14 0440  LABPROT 27.1*  INR 2.55*    Urinalysis:  Recent Labs  12/26/14 1455  COLORURINE AMBER*  LABSPEC 1.021  PHURINE 5.0   GLUCOSEU NEGATIVE  HGBUR LARGE*  BILIRUBINUR NEGATIVE  KETONESUR 15*  PROTEINUR 100*  UROBILINOGEN 0.2  NITRITE NEGATIVE  LEUKOCYTESUR LARGE*      Imaging: Dg Chest Port 1 View  12/27/2014  CLINICAL DATA:  CHF. EXAM: PORTABLE CHEST 1 VIEW COMPARISON:  12/26/2014. FINDINGS: Right PICC line in stable position. Mediastinum hilar structures are unremarkable. Low lung volumes with basilar atelectasis. Heart size normal. No pleural effusion or pneumothorax. IMPRESSION: 1. Right PICC line stable position. 2. Lung volumes with mild basilar atelectasis. Electronically Signed   By: Maisie Fus  Register   On: 12/27/2014 07:22   Dg Chest Port 1 View  12/26/2014  CLINICAL DATA:  Sepsis EXAM: PORTABLE CHEST 1 VIEW COMPARISON:  Radiograph 01/04/15 in FINDINGS: RIGHT PICC line unchanged. Stable cardiac silhouette which is mildly enlarged. Low lung volumes. Lung bases are poorly evaluated. Upper lobes are clear. IMPRESSION: Low lung volumes and mild cardiomegaly.  No interval change. Electronically Signed   By: Genevive Bi M.D.   On: 12/26/2014 15:30     Medications:       Assessment/ Plan:  64 y.o. 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   1. Acute renal failure, likely ATN secondary to concurrent infection. Baseline Creatinine 0.64 in May of 2016 according to care everywhere recs 2. Diabetes with complications, Nephropathy. Poorly controlled. Hemoglobin A1c 8.5% in May.  3. Chronic systolic congestive heart failure with acute exacerbation 4. 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 5. Hemolytic anemia- requiring use of  prednisone. Not clear when it was started 6. Hematuria and proteinuria 7. Urinary tract infection 8. Anasarca/Generalized edema 9. Thrombocytopenia 10.  Hyperkalemia.  Plan: Patient has worsening renal function. BUN currently 219 with a creatinine of 6.2. Diuretic therapy has been stopped. We will proceed with initiation of [facility]. We will plan for dialysis tonight with a blood flow rate of 150, dialysate flow rate of 300, an ultrafiltration target of 0.5 kg as tolerated. We will provide albumin for blood pressure support. We will plan for dialysis tomorrow as well as Wednesday. It  may be difficult to dialyze the patient given her underlying heart failure.  Hyperkalemia to be treated with dialysis as well.    LOS:  Kristina Moon 10/31/20164:32 PM

## 2014-12-27 NOTE — Procedures (Signed)
Placement of right jugular Trialysis catheter with US and fluoroscopic guidance.  Tip at SVC/RA junction.  Catheter is ready to use.

## 2014-12-28 ENCOUNTER — Other Ambulatory Visit (HOSPITAL_COMMUNITY): Payer: Self-pay

## 2014-12-28 ENCOUNTER — Institutional Professional Consult (permissible substitution) (HOSPITAL_COMMUNITY): Payer: Self-pay

## 2014-12-28 LAB — CBC
HEMATOCRIT: 17.5 % — AB (ref 36.0–46.0)
Hemoglobin: 5.9 g/dL — CL (ref 12.0–15.0)
MCH: 30.3 pg (ref 26.0–34.0)
MCHC: 33.7 g/dL (ref 30.0–36.0)
MCV: 89.7 fL (ref 78.0–100.0)
Platelets: 30 10*3/uL — ABNORMAL LOW (ref 150–400)
RBC: 1.95 MIL/uL — AB (ref 3.87–5.11)
RDW: 18.5 % — ABNORMAL HIGH (ref 11.5–15.5)
WBC: 8.8 10*3/uL (ref 4.0–10.5)

## 2014-12-28 LAB — RENAL FUNCTION PANEL
Albumin: 2.3 g/dL — ABNORMAL LOW (ref 3.5–5.0)
Anion gap: 22 — ABNORMAL HIGH (ref 5–15)
BUN: 260 mg/dL — AB (ref 6–20)
CHLORIDE: 106 mmol/L (ref 101–111)
CO2: 14 mmol/L — ABNORMAL LOW (ref 22–32)
Calcium: 8.1 mg/dL — ABNORMAL LOW (ref 8.9–10.3)
Creatinine, Ser: 6.91 mg/dL — ABNORMAL HIGH (ref 0.44–1.00)
GFR, EST AFRICAN AMERICAN: 7 mL/min — AB (ref >60)
GFR, EST NON AFRICAN AMERICAN: 6 mL/min — AB (ref >60)
Glucose, Bld: 154 mg/dL — ABNORMAL HIGH (ref 65–99)
POTASSIUM: 5.4 mmol/L — AB (ref 3.5–5.1)
Phosphorus: 12.1 mg/dL — ABNORMAL HIGH (ref 2.5–4.6)
Sodium: 142 mmol/L (ref 135–145)

## 2014-12-28 LAB — HEPATIC FUNCTION PANEL
ALT: 76 U/L — AB (ref 14–54)
AST: 137 U/L — AB (ref 15–41)
Albumin: 2.4 g/dL — ABNORMAL LOW (ref 3.5–5.0)
Alkaline Phosphatase: 60 U/L (ref 38–126)
BILIRUBIN DIRECT: 1.4 mg/dL — AB (ref 0.1–0.5)
Indirect Bilirubin: 1.4 mg/dL — ABNORMAL HIGH (ref 0.3–0.9)
Total Bilirubin: 2.8 mg/dL — ABNORMAL HIGH (ref 0.3–1.2)
Total Protein: 3.8 g/dL — ABNORMAL LOW (ref 6.5–8.1)

## 2014-12-28 LAB — LACTIC ACID, PLASMA
LACTIC ACID, VENOUS: 8.3 mmol/L — AB (ref 0.5–2.0)
Lactic Acid, Venous: 7 mmol/L (ref 0.5–2.0)

## 2014-12-28 LAB — PREPARE RBC (CROSSMATCH)

## 2014-12-28 MED ORDER — IOHEXOL 300 MG/ML  SOLN: 25.0000 mL | INTRAMUSCULAR | Status: AC

## 2014-12-28 DEATH — deceased

## 2014-12-29 ENCOUNTER — Encounter: Payer: Self-pay | Admitting: Radiology

## 2014-12-29 ENCOUNTER — Other Ambulatory Visit (HOSPITAL_COMMUNITY): Payer: Self-pay

## 2014-12-29 LAB — RENAL FUNCTION PANEL
ANION GAP: 21 — AB (ref 5–15)
Albumin: 2.4 g/dL — ABNORMAL LOW (ref 3.5–5.0)
BUN: 181 mg/dL — ABNORMAL HIGH (ref 6–20)
CALCIUM: 7.9 mg/dL — AB (ref 8.9–10.3)
CO2: 14 mmol/L — AB (ref 22–32)
CREATININE: 5.86 mg/dL — AB (ref 0.44–1.00)
Chloride: 103 mmol/L (ref 101–111)
GFR, EST AFRICAN AMERICAN: 8 mL/min — AB (ref >60)
GFR, EST NON AFRICAN AMERICAN: 7 mL/min — AB (ref >60)
Glucose, Bld: 129 mg/dL — ABNORMAL HIGH (ref 65–99)
PHOSPHORUS: 10.6 mg/dL — AB (ref 2.5–4.6)
Potassium: 5.1 mmol/L (ref 3.5–5.1)
SODIUM: 138 mmol/L (ref 135–145)

## 2014-12-29 LAB — TYPE AND SCREEN
ABO/RH(D): O POS
ANTIBODY SCREEN: NEGATIVE
Unit division: 0
Unit division: 0
Unit division: 0
Unit division: 0

## 2014-12-29 LAB — PREPARE PLATELET PHERESIS: Unit division: 0

## 2014-12-29 LAB — HEPATITIS B CORE ANTIBODY, TOTAL: Hep B Core Total Ab: NEGATIVE

## 2014-12-29 LAB — CBC WITH DIFFERENTIAL/PLATELET
BASOS ABS: 0 10*3/uL (ref 0.0–0.1)
BASOS PCT: 0 %
EOS ABS: 0 10*3/uL (ref 0.0–0.7)
Eosinophils Relative: 0 %
HEMATOCRIT: 20.7 % — AB (ref 36.0–46.0)
HEMOGLOBIN: 7.3 g/dL — AB (ref 12.0–15.0)
Lymphocytes Relative: 12 %
Lymphs Abs: 1 10*3/uL (ref 0.7–4.0)
MCH: 31.5 pg (ref 26.0–34.0)
MCHC: 35.3 g/dL (ref 30.0–36.0)
MCV: 89.2 fL (ref 78.0–100.0)
MONOS PCT: 4 %
Monocytes Absolute: 0.3 10*3/uL (ref 0.1–1.0)
NEUTROS ABS: 6.7 10*3/uL (ref 1.7–7.7)
NEUTROS PCT: 83 %
Platelets: 30 10*3/uL — ABNORMAL LOW (ref 150–400)
RBC: 2.32 MIL/uL — AB (ref 3.87–5.11)
RDW: 17.4 % — ABNORMAL HIGH (ref 11.5–15.5)
WBC: 8 10*3/uL (ref 4.0–10.5)

## 2014-12-29 LAB — BLOOD GAS, ARTERIAL
Acid-base deficit: 14.8 mmol/L — ABNORMAL HIGH (ref 0.0–2.0)
BICARBONATE: 9.7 meq/L — AB (ref 20.0–24.0)
FIO2: 0.21
O2 Saturation: 98.6 %
PATIENT TEMPERATURE: 97.7
PH ART: 7.375 (ref 7.350–7.450)
TCO2: 10.2 mmol/L (ref 0–100)
pCO2 arterial: 16.9 mmHg — CL (ref 35.0–45.0)
pO2, Arterial: 122 mmHg — ABNORMAL HIGH (ref 80.0–100.0)

## 2014-12-29 LAB — CULTURE, BLOOD (ROUTINE X 2)
Culture: NO GROWTH
Culture: NO GROWTH

## 2014-12-29 LAB — HEPATITIS B SURFACE ANTIBODY,QUALITATIVE: HEP B S AB: NONREACTIVE

## 2014-12-29 LAB — HEPATITIS B SURFACE ANTIGEN: HEP B S AG: NEGATIVE

## 2014-12-29 NOTE — Progress Notes (Addendum)
Subjective:  Pt due for HD again today. Still quite volume overloaded.  Will plan for daily HD for now.    Objective:  Vital signs in last 24 hours:  Temperature 97.1 pulse 77 respirations 24 blood pressure 98/66  Weight change:  There were no vitals filed for this visit.  Intake/Output:   No intake or output data in the 24 hours ending 12/29/14 1130   Physical Exam: General: Somnolent but arousable  HEENT Anicteric, moist oral mucus membranes, NG in place  Neck supple  Pulm/lungs Basilar rales, normal effort  CVS/Heart Regular, no rub  Abdomen:  Soft, non tender, non distended  Extremities: +++ dependent edema  Neurologic: Lethargic but arousable  Skin: Normal turgor  Access: Right internal jugular temporary dialysis catheter   Foley    Basic Metabolic Panel:   Recent Labs Lab 12/23/14 0513 12/24/14 0627 12/25/14 0650 12/26/14 1437 12/27/14 0440 12/28/14 0315 12/29/14 0646  NA 134*  --  138 144 145 142 138  K 4.8  --  5.4* 6.2* 6.1* 5.4* 5.1  CL 102  --  105 110 110 106 103  CO2 22  --  21* 16* 15* 14* 14*  GLUCOSE 69  --  66 110* 121* 154* 129*  BUN 148*  --  188* 205* 219* 260* 181*  CREATININE 3.72*  --  4.44* 5.74* 6.23* 6.91* 5.86*  CALCIUM 8.4*  --  8.1* 8.5* 8.7* 8.1* 7.9*  MG 2.7* 2.7*  --   --   --   --   --   PHOS 6.5*  --  7.9* 10.2* 10.9* 12.1* 10.6*     CBC:  Recent Labs Lab 12/23/14 0513 12/25/14 0650 12/25/14 1114 12/26/14 1359 12/27/14 0440 12/28/14 0600 12/29/14 0647  WBC 6.8 7.5  --  7.9 8.7 8.8 8.0  NEUTROABS 5.1 5.6  --   --  7.0  --  6.7  HGB 9.3* 6.7* 6.6* 7.0* 6.6* 5.9* 7.3*  HCT 26.6* 19.8* 19.3* 20.9* 19.5* 17.5* 20.7*  MCV 90.8 91.7  --  90.1 88.6 89.7 89.2  PLT 32* 33*  --  36* 31* 30* 30*      Microbiology:  Recent Results (from the past 720 hour(s))  Culture, Urine     Status: None   Collection Time: 12/21/14  8:08 AM  Result Value Ref Range Status   Specimen Description URINE, RANDOM  Final    Special Requests NONE  Final   Culture NO GROWTH 1 DAY  Final   Report Status 12/22/2014 FINAL  Final  Culture, blood (routine x 2)     Status: None (Preliminary result)   Collection Time: 12/24/14  5:55 PM  Result Value Ref Range Status   Specimen Description BLOOD PICC LINE  Final   Special Requests BOTTLES DRAWN AEROBIC AND ANAEROBIC 5CC  Final   Culture NO GROWTH 4 DAYS  Final   Report Status PENDING  Incomplete  Culture, blood (routine x 2)     Status: None (Preliminary result)   Collection Time: 12/24/14  9:07 PM  Result Value Ref Range Status   Specimen Description BLOOD RIGHT WRIST  Final   Special Requests BOTTLES DRAWN AEROBIC AND ANAEROBIC 10CC  Final   Culture NO GROWTH 4 DAYS  Final   Report Status PENDING  Incomplete  Culture, Urine     Status: None   Collection Time: 12/26/14  2:55 PM  Result Value Ref Range Status   Specimen Description URINE, RANDOM  Final   Special  Requests NONE  Final   Culture NO GROWTH 1 DAY  Final   Report Status 12/27/2014 FINAL  Final  Culture, blood (routine x 2)     Status: None (Preliminary result)   Collection Time: 12/26/14  3:12 PM  Result Value Ref Range Status   Specimen Description BLOOD LEFT ANTECUBITAL  Final   Special Requests BOTTLES DRAWN AEROBIC ONLY 9CC  Final   Culture NO GROWTH 2 DAYS  Final   Report Status PENDING  Incomplete  Culture, blood (routine x 2)     Status: None (Preliminary result)   Collection Time: 12/26/14  3:21 PM  Result Value Ref Range Status   Specimen Description BLOOD LEFT HAND  Final   Special Requests IN PEDIATRIC BOTTLE 1CC  Final   Culture NO GROWTH 2 DAYS  Final   Report Status PENDING  Incomplete    Coagulation Studies:  Recent Labs  12/27/14 0440  LABPROT 27.1*  INR 2.55*    Urinalysis:  Recent Labs  12/26/14 1455  COLORURINE AMBER*  LABSPEC 1.021  PHURINE 5.0  GLUCOSEU NEGATIVE  HGBUR LARGE*  BILIRUBINUR NEGATIVE  KETONESUR 15*  PROTEINUR 100*  UROBILINOGEN 0.2   NITRITE NEGATIVE  LEUKOCYTESUR LARGE*      Imaging: Ct Abdomen Pelvis Wo Contrast  12/29/2014  CLINICAL DATA:  GI bleed. Patient is septic. For evaluation of source of sepsis. EXAM: CT ABDOMEN AND PELVIS WITHOUT CONTRAST TECHNIQUE: Multidetector CT imaging of the abdomen and pelvis was performed following the standard protocol without IV contrast. COMPARISON:  None. FINDINGS: Examination is technically limited due to motion artifact. Small right pleural effusion. Subpleural nodule in the right lung base laterally measures 14 mm. This could represent parenchymal mass versus nodular pleural thickening. Metastasis or primary lesion not excluded. Free fluid demonstrated in the upper abdomen, along the pericolic gutters, and into the pelvis. This is probably ascites. Evaluation of solid organs is limited due to motion artifact and noncontrast imaging. The liver and spleen are not enlarged. The gallbladder is not identified and may be surgically absent. There is probably pneumobilia which is likely postoperative. The head of the pancreas is prominent although is difficult to separate from the duodenum. Mass in the pancreatic head is not excluded. Right adrenal gland nodule measuring 19 mm diameter. Hounsfield unit measurements are indeterminate. Mass or metastasis is not excluded. Stone in the left kidney measuring 10 mm diameter. No hydronephrosis in either kidney. Enteric tube in the stomach. Stomach, stomach and small bowel are decompressed. Contrast material in the colon without abnormal distention. No free air in the abdomen. Diffuse edema throughout the subcutaneous fatty tissues. Pelvis: Bladder is decompressed with a Foley catheter. Gas in the bladder is likely instrumentation related. Uterus and ovaries are not enlarged. No significant pelvic lymphadenopathy or mass lesion identified. Appendix is not identified. Degenerative changes throughout the spine and hips. No destructive bone lesions. IMPRESSION:  No obvious abdominal abscess. Small amount of free fluid in the abdomen probably ascites. Diffuse soft tissue edema. Foley catheter in the bladder. Probable pneumobilia, likely related to prior surgery. Prominence of the head of the pancreas. Can't exclude mass lesion. Indeterminate 1.9 cm nodule in the right adrenal gland. Small right pleural effusion with 13 mm nodule in the right lung base versus nodular pleural thickening. Metastatic disease not excluded. Nonobstructing stone in the left kidney. Electronically Signed   By: Burman Nieves M.D.   On: 12/29/2014 02:48   Ir Fluoro Guide Cv Line Right  12/27/2014  INDICATION:  64 year old with acute renal failure. Request for a temporary dialysis catheter. EXAM: FLUOROSCOPIC AND ULTRASOUND GUIDED PLACEMENT OF A NON TUNNELED DIALYSIS CATHETER Physician: Rachelle Hora. Henn, MD FLUOROSCOPY TIME:  6 seconds, 15.08 mGy MEDICATIONS: None ANESTHESIA/SEDATION: Moderate sedation time: None PROCEDURE: Informed consent was obtained from the patient's family. Patient was placed supine on the interventional table. Ultrasound demonstrated a patent right internal jugular vein. The right side of the neck was prepped and draped in sterile fashion. Maximal barrier sterile technique was utilized including caps, mask, sterile gowns, sterile gloves, sterile drape, hand hygiene and skin antiseptic. Skin was anesthetized with 1% lidocaine. 21 gauge needle directed into the right internal jugular vein with ultrasound guidance and a micropuncture dilator set was placed. Wire was advanced into the IVC. The tract was dilated to accommodate a 13 French x 20 cm Trialysis catheter. Catheter tip was placed at the junction of the SVC and right atrium. All the lumens aspirated and flushed appropriately. Appropriate amount of heparin was placed in the dialysis lumens. Catheter was sutured to the skin. Dressing was placed at the catheter site. Fluoroscopic and ultrasound images were taken and saved for  documentation. FINDINGS: Catheter tip at the superior cavoatrial junction. Estimated blood loss: Minimal COMPLICATIONS: None IMPRESSION: Successful placement of a right jugular Trialysis catheter with ultrasound and fluoroscopic guidance. Electronically Signed   By: Richarda Overlie M.D.   On: 12/27/2014 17:04   Ir US Guide Vasc Access Right  12/27/2014  INDICATION: 64 year old with acute renal failure. Request for a temporary dialysis catheter. EXAM: FLUOROSCOPIC AND ULTRASOUND GUIDED PLACEMENT OF A NON TUNNELED DIALYSIS CATHETER Physician: Rachelle Hora. Henn, MD FLUOROSCOPY TIME:  6 seconds, 15.08 mGy MEDICATIONS: None ANESTHESIA/SEDATION: Moderate sedation time: None PROCEDURE: Informed consent was obtained from the patient's family. Patient was placed supine on the interventional table. Ultrasound demonstrated a patent right internal jugular vein. The right side of the neck was prepped and draped in sterile fashion. Maximal barrier sterile technique was utilized including caps, mask, sterile gowns, sterile gloves, sterile drape, hand hygiene and skin antiseptic. Skin was anesthetized with 1% lidocaine. 21 gauge needle directed into the right internal jugular vein with ultrasound guidance and a micropuncture dilator set was placed. Wire was advanced into the IVC. The tract was dilated to accommodate a 13 French x 20 cm Trialysis catheter. Catheter tip was placed at the junction of the SVC and right atrium. All the lumens aspirated and flushed appropriately. Appropriate amount of heparin was placed in the dialysis lumens. Catheter was sutured to the skin. Dressing was placed at the catheter site. Fluoroscopic and ultrasound images were taken and saved for documentation. FINDINGS: Catheter tip at the superior cavoatrial junction. Estimated blood loss: Minimal COMPLICATIONS: None IMPRESSION: Successful placement of a right jugular Trialysis catheter with ultrasound and fluoroscopic guidance. Electronically Signed   By: Richarda Overlie M.D.   On: 12/27/2014 17:04   Dg Abd Portable 1v  12/28/2014  CLINICAL DATA:  Encounter for nasogastric tube placement. EXAM: PORTABLE ABDOMEN - 1 VIEW COMPARISON:  None. FINDINGS: Patient is rotated. Given the patient's rotation, it appears that nasogastric tube does enter the stomach with tip looped into the proximal portion of the stomach. No abnormal bowel dilatation is noted. IMPRESSION: Nasogastric tube appears to be looped within the stomach, with distal tip seen in the proximal stomach. Electronically Signed   By: Lupita Raider, M.D.   On: 12/28/2014 13:13     Medications:       Assessment/  Plan:  64 y.o. 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   1. Acute renal failure, likely ATN secondary to concurrent infection. Baseline Creatinine 0.64 in May of 2016 according to care everywhere recs 2. Diabetes with complications, Nephropathy. Poorly controlled. Hemoglobin A1c 8.5% in May.  3. Chronic systolic congestive heart failure with acute exacerbation 4. 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 5. Hemolytic anemia- requiring use of prednisone. Not clear when it was started 6. Hematuria and proteinuria 7. Urinary tract infection 8. Anasarca/Generalized edema 9. Thrombocytopenia 10.  Hyperkalemia.  Plan: Patient due for dialysis again today. Azotemia has improved a bit. BUN currently down to 181 and creatinine is down to 5.8. The patient remains oliguric at this point in time. We will continue dialysis with gentle ultrafiltration as tolerated. We will plan for daily dialysis for now. Overall the patient's  prognosis remains quite poor at present. We will monitor along with you very closely.   LOS:  Arora Coakley 11/2/201611:30 AM

## 2014-12-30 LAB — CBC WITH DIFFERENTIAL/PLATELET
BASOS ABS: 0 10*3/uL (ref 0.0–0.1)
BASOS PCT: 0 %
EOS ABS: 0 10*3/uL (ref 0.0–0.7)
Eosinophils Relative: 0 %
HEMATOCRIT: 18.7 % — AB (ref 36.0–46.0)
Hemoglobin: 6.6 g/dL — CL (ref 12.0–15.0)
Lymphocytes Relative: 10 %
Lymphs Abs: 0.7 10*3/uL (ref 0.7–4.0)
MCH: 31.9 pg (ref 26.0–34.0)
MCHC: 35.3 g/dL (ref 30.0–36.0)
MCV: 90.3 fL (ref 78.0–100.0)
MONO ABS: 0.3 10*3/uL (ref 0.1–1.0)
MONOS PCT: 4 %
NEUTROS ABS: 6 10*3/uL (ref 1.7–7.7)
NEUTROS PCT: 86 %
Platelets: 16 10*3/uL — CL (ref 150–400)
RBC: 2.07 MIL/uL — ABNORMAL LOW (ref 3.87–5.11)
RDW: 18 % — AB (ref 11.5–15.5)
WBC: 7 10*3/uL (ref 4.0–10.5)

## 2014-12-30 LAB — RENAL FUNCTION PANEL
ALBUMIN: 2.2 g/dL — AB (ref 3.5–5.0)
Anion gap: 24 — ABNORMAL HIGH (ref 5–15)
BUN: 156 mg/dL — AB (ref 6–20)
CALCIUM: 7.1 mg/dL — AB (ref 8.9–10.3)
CO2: 14 mmol/L — ABNORMAL LOW (ref 22–32)
CREATININE: 6.02 mg/dL — AB (ref 0.44–1.00)
Chloride: 107 mmol/L (ref 101–111)
GFR calc Af Amer: 8 mL/min — ABNORMAL LOW (ref >60)
GFR, EST NON AFRICAN AMERICAN: 7 mL/min — AB (ref >60)
GLUCOSE: 194 mg/dL — AB (ref 65–99)
PHOSPHORUS: 10.2 mg/dL — AB (ref 2.5–4.6)
POTASSIUM: 4.9 mmol/L (ref 3.5–5.1)
SODIUM: 145 mmol/L (ref 135–145)

## 2014-12-30 LAB — PREPARE RBC (CROSSMATCH)

## 2014-12-30 LAB — AMMONIA: AMMONIA: 44 umol/L — AB (ref 9–35)

## 2014-12-30 LAB — PROTIME-INR
INR: 3.17 — ABNORMAL HIGH (ref 0.00–1.49)
Prothrombin Time: 31.9 seconds — ABNORMAL HIGH (ref 11.6–15.2)

## 2014-12-31 LAB — COMPREHENSIVE METABOLIC PANEL
ALBUMIN: 2.3 g/dL — AB (ref 3.5–5.0)
ALT: 143 U/L — AB (ref 14–54)
AST: 237 U/L — AB (ref 15–41)
Alkaline Phosphatase: 90 U/L (ref 38–126)
Anion gap: 26 — ABNORMAL HIGH (ref 5–15)
BUN: 161 mg/dL — ABNORMAL HIGH (ref 6–20)
CO2: 15 mmol/L — ABNORMAL LOW (ref 22–32)
Calcium: 6.6 mg/dL — ABNORMAL LOW (ref 8.9–10.3)
Chloride: 102 mmol/L (ref 101–111)
Creatinine, Ser: 6.74 mg/dL — ABNORMAL HIGH (ref 0.44–1.00)
GFR calc Af Amer: 7 mL/min — ABNORMAL LOW (ref >60)
GFR, EST NON AFRICAN AMERICAN: 6 mL/min — AB (ref >60)
Glucose, Bld: 243 mg/dL — ABNORMAL HIGH (ref 65–99)
POTASSIUM: 4.7 mmol/L (ref 3.5–5.1)
Sodium: 143 mmol/L (ref 135–145)
TOTAL PROTEIN: 4.3 g/dL — AB (ref 6.5–8.1)
Total Bilirubin: 4.2 mg/dL — ABNORMAL HIGH (ref 0.3–1.2)

## 2014-12-31 LAB — MAGNESIUM: MAGNESIUM: 2.6 mg/dL — AB (ref 1.7–2.4)

## 2014-12-31 LAB — TYPE AND SCREEN
ABO/RH(D): O POS
ANTIBODY SCREEN: NEGATIVE
Unit division: 0

## 2014-12-31 LAB — CBC
HEMATOCRIT: 24.7 % — AB (ref 36.0–46.0)
Hemoglobin: 8.6 g/dL — ABNORMAL LOW (ref 12.0–15.0)
MCH: 30.5 pg (ref 26.0–34.0)
MCHC: 34.8 g/dL (ref 30.0–36.0)
MCV: 87.6 fL (ref 78.0–100.0)
PLATELETS: 29 10*3/uL — AB (ref 150–400)
RBC: 2.82 MIL/uL — ABNORMAL LOW (ref 3.87–5.11)
RDW: 17 % — AB (ref 11.5–15.5)
WBC: 8.3 10*3/uL (ref 4.0–10.5)

## 2014-12-31 LAB — CBC WITH DIFFERENTIAL/PLATELET
BASOS ABS: 0 10*3/uL (ref 0.0–0.1)
BASOS PCT: 0 %
EOS PCT: 0 %
Eosinophils Absolute: 0 10*3/uL (ref 0.0–0.7)
HEMATOCRIT: 20.1 % — AB (ref 36.0–46.0)
Hemoglobin: 7.1 g/dL — ABNORMAL LOW (ref 12.0–15.0)
LYMPHS PCT: 11 %
Lymphs Abs: 0.7 10*3/uL (ref 0.7–4.0)
MCH: 30.6 pg (ref 26.0–34.0)
MCHC: 35.3 g/dL (ref 30.0–36.0)
MCV: 86.6 fL (ref 78.0–100.0)
MONO ABS: 0.2 10*3/uL (ref 0.1–1.0)
Monocytes Relative: 4 %
NEUTROS ABS: 5.3 10*3/uL (ref 1.7–7.7)
Neutrophils Relative %: 86 %
PLATELETS: 21 10*3/uL — AB (ref 150–400)
RBC: 2.32 MIL/uL — AB (ref 3.87–5.11)
RDW: 17.6 % — AB (ref 11.5–15.5)
WBC: 6.2 10*3/uL (ref 4.0–10.5)

## 2014-12-31 LAB — CULTURE, BLOOD (ROUTINE X 2)
CULTURE: NO GROWTH
Culture: NO GROWTH

## 2014-12-31 LAB — PREPARE PLATELET PHERESIS: Unit division: 0

## 2014-12-31 LAB — PROTIME-INR
INR: 2.4 — AB (ref 0.00–1.49)
Prothrombin Time: 25.9 seconds — ABNORMAL HIGH (ref 11.6–15.2)

## 2014-12-31 LAB — PHOSPHORUS: PHOSPHORUS: 11 mg/dL — AB (ref 2.5–4.6)

## 2014-12-31 NOTE — Progress Notes (Signed)
Subjective:  Patient overall remains critically ill. Currently on BiPAP. Tolerated doses for only 30 minutes on Wednesday. [place] not attempted on Thursday given critical illness. Clinical staff to have discussion with family regarding move to comfort care today.   Objective:  Vital signs in last 24 hours:  Temperature 94.4 pulse 65 respirations 16 blood pressure 76/44  Weight change:  There were no vitals filed for this visit.  Intake/Output:   No intake or output data in the 24 hours ending 12/31/14 0903   Physical Exam: General: Currently on BiPAP  HEENT Anicteric, moist oral mucus membranes, NG in place  Neck supple  Pulm/lungs Basilar rales, normal effort  CVS/Heart Regular, no rub  Abdomen:  Soft, non tender, non distended  Extremities: +++ dependent edema  Neurologic: Appears obtunded  Skin: Normal turgor  Access: Right internal jugular temporary dialysis catheter   Foley    Basic Metabolic Panel:   Recent Labs Lab 12/26/14 1437 12/27/14 0440 12/28/14 0315 12/29/14 0646 12/30/14 0615  NA 144 145 142 138 145  K 6.2* 6.1* 5.4* 5.1 4.9  CL 110 110 106 103 107  CO2 16* 15* 14* 14* 14*  GLUCOSE 110* 121* 154* 129* 194*  BUN 205* 219* 260* 181* 156*  CREATININE 5.74* 6.23* 6.91* 5.86* 6.02*  CALCIUM 8.5* 8.7* 8.1* 7.9* 7.1*  PHOS 10.2* 10.9* 12.1* 10.6* 10.2*     CBC:  Recent Labs Lab 12/25/14 0650  12/27/14 0440 12/28/14 0600 12/29/14 0647 12/30/14 0615 12/30/14 2009 12/31/14 0800  WBC 7.5  < > 8.7 8.8 8.0 7.0 8.3 6.2  NEUTROABS 5.6  --  7.0  --  6.7 6.0  --  5.3  HGB 6.7*  < > 6.6* 5.9* 7.3* 6.6* 8.6* 7.1*  HCT 19.8*  < > 19.5* 17.5* 20.7* 18.7* 24.7* 20.1*  MCV 91.7  < > 88.6 89.7 89.2 90.3 87.6 86.6  PLT 33*  < > 31* 30* 30* 16* 29* PENDING  < > = values in this interval not displayed.    Microbiology:  Recent Results (from the past 720 hour(s))  Culture, Urine     Status: None   Collection Time: 12/21/14  8:08 AM  Result Value  Ref Range Status   Specimen Description URINE, RANDOM  Final   Special Requests NONE  Final   Culture NO GROWTH 1 DAY  Final   Report Status 12/22/2014 FINAL  Final  Culture, blood (routine x 2)     Status: None   Collection Time: 12/24/14  5:55 PM  Result Value Ref Range Status   Specimen Description BLOOD PICC LINE  Final   Special Requests BOTTLES DRAWN AEROBIC AND ANAEROBIC 5CC  Final   Culture NO GROWTH 5 DAYS  Final   Report Status 12/29/2014 FINAL  Final  Culture, blood (routine x 2)     Status: None   Collection Time: 12/24/14  9:07 PM  Result Value Ref Range Status   Specimen Description BLOOD RIGHT WRIST  Final   Special Requests BOTTLES DRAWN AEROBIC AND ANAEROBIC 10CC  Final   Culture NO GROWTH 5 DAYS  Final   Report Status 12/29/2014 FINAL  Final  Culture, Urine     Status: None   Collection Time: 12/26/14  2:55 PM  Result Value Ref Range Status   Specimen Description URINE, RANDOM  Final   Special Requests NONE  Final   Culture NO GROWTH 1 DAY  Final   Report Status 12/27/2014 FINAL  Final  Culture, blood (routine  x 2)     Status: None (Preliminary result)   Collection Time: 12/26/14  3:12 PM  Result Value Ref Range Status   Specimen Description BLOOD LEFT ANTECUBITAL  Final   Special Requests BOTTLES DRAWN AEROBIC ONLY 9CC  Final   Culture NO GROWTH 4 DAYS  Final   Report Status PENDING  Incomplete  Culture, blood (routine x 2)     Status: None (Preliminary result)   Collection Time: 12/26/14  3:21 PM  Result Value Ref Range Status   Specimen Description BLOOD LEFT HAND  Final   Special Requests IN PEDIATRIC BOTTLE 1CC  Final   Culture NO GROWTH 4 DAYS  Final   Report Status PENDING  Incomplete    Coagulation Studies:  Recent Labs  12/30/14 2009 12/31/14 0800  LABPROT 31.9* 25.9*  INR 3.17* 2.40*    Urinalysis: No results for input(s): COLORURINE, LABSPEC, PHURINE, GLUCOSEU, HGBUR, BILIRUBINUR, KETONESUR, PROTEINUR, UROBILINOGEN, NITRITE,  LEUKOCYTESUR in the last 72 hours.  Invalid input(s): APPERANCEUR    Imaging: Dg Chest Port 1 View  12/29/2014  CLINICAL DATA:  Congestive heart failure EXAM: PORTABLE CHEST 1 VIEW COMPARISON:  December 27, 2014 FINDINGS: There is mild interstitial edema in the lower lung zones. No airspace consolidation. Lungs elsewhere clear. There is cardiomegaly with mild pulmonary venous hypertension. Central catheter tip is at the cavoatrial junction. Nasogastric tube tip and side port are in the stomach. No pneumothorax. No adenopathy. IMPRESSION: Evidence of a degree of congestive heart failure. No airspace consolidation. No pneumothorax. Electronically Signed   By: Bretta Bang III M.D.   On: 12/29/2014 13:06     Medications:       Assessment/ Plan:  64 y.o. 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   1. Acute renal failure, likely ATN secondary to concurrent infection. Baseline Creatinine 0.64 in May of 2016 according to care everywhere recs 2. Diabetes with complications, Nephropathy. Poorly controlled. Hemoglobin A1c 8.5% in May.  3. Chronic systolic congestive heart failure with acute exacerbation 4. 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 5. Hemolytic anemia- requiring use of prednisone. Not clear when it was started 6. Hematuria and proteinuria 7. Urinary tract infection 8. Anasarca/Generalized edema 9. Thrombocytopenia 10.  Hyperkalemia.  Plan: Patient remains critically ill at this point in time. She has not tolerated dialysis well at all. Her last also treatment was cut short after only 30 minutes of  attempted therapy. Blood pressure remains quite low at 76/44. We do not recommend any further attempted intermittent hemodialysis as this could cause worsening of her status. We are in agreement with the clinical team to move towards comfort care in this situation. Overall prognosis quite poor.   LOS:  Kristina Moon 11/4/20169:03 AM

## 2015-01-01 LAB — PREPARE FRESH FROZEN PLASMA
UNIT DIVISION: 0
UNIT DIVISION: 0
Unit division: 0
Unit division: 0

## 2015-01-27 DEATH — deceased

## 2017-06-08 IMAGING — US IR US GUIDE VASC ACCESS RIGHT
1 series · 1 of 1 positions shown · non-contrast
Comparison: none

INDICATION: 64-year-old with acute renal failure. Request for a temporary
dialysis catheter.

[Series 1: ir fluoro/shunt/fist · 1 of 1 slices shown]
[im 1/1]
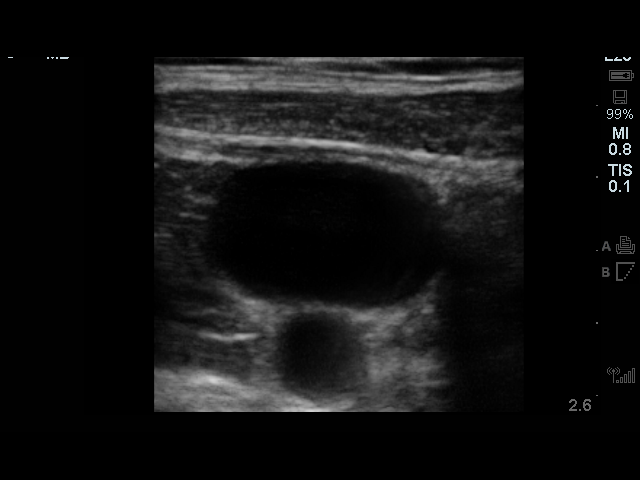

[1 of 1 positions shown; findings below may reference images not displayed]

EXAM:
FLUOROSCOPIC AND ULTRASOUND GUIDED PLACEMENT OF A NON TUNNELED
DIALYSIS CATHETER

FLUOROSCOPY TIME:  6 seconds, 15.08 mGy

MEDICATIONS:
None

ANESTHESIA/SEDATION:
Moderate sedation time: None

PROCEDURE:
Informed consent was obtained from the patient's family. Patient was
placed supine on the interventional table. Ultrasound demonstrated a
patent right internal jugular vein. The right side of the neck was
prepped and draped in sterile fashion. Maximal barrier sterile
technique was utilized including caps, mask, sterile gowns, sterile
gloves, sterile drape, hand hygiene and skin antiseptic.

Skin was anesthetized with 1% lidocaine. 21 gauge needle directed
into the right internal jugular vein with ultrasound guidance and a
micropuncture dilator set was placed. Wire was advanced into the
IVC. The tract was dilated to accommodate a 13 French x 20 cm
Trialysis catheter. Catheter tip was placed at the junction of the
SVC and right atrium. All the lumens aspirated and flushed
appropriately. Appropriate amount of heparin was placed in the
dialysis lumens. Catheter was sutured to the skin. Dressing was
placed at the catheter site. Fluoroscopic and ultrasound images were
taken and saved for documentation.
FINDINGS: Catheter tip at the superior cavoatrial junction.

Estimated blood loss: Minimal

COMPLICATIONS:
None
IMPRESSION: Successful placement of a right jugular Trialysis catheter with
ultrasound and fluoroscopic guidance.

## 2017-06-10 IMAGING — DX DG CHEST 1V PORT
1 series · 1 of 1 positions shown · non-contrast
Comparison: December 27, 2014

CLINICAL DATA: Congestive heart failure

EXAM:
PORTABLE CHEST 1 VIEW

[chest ap]
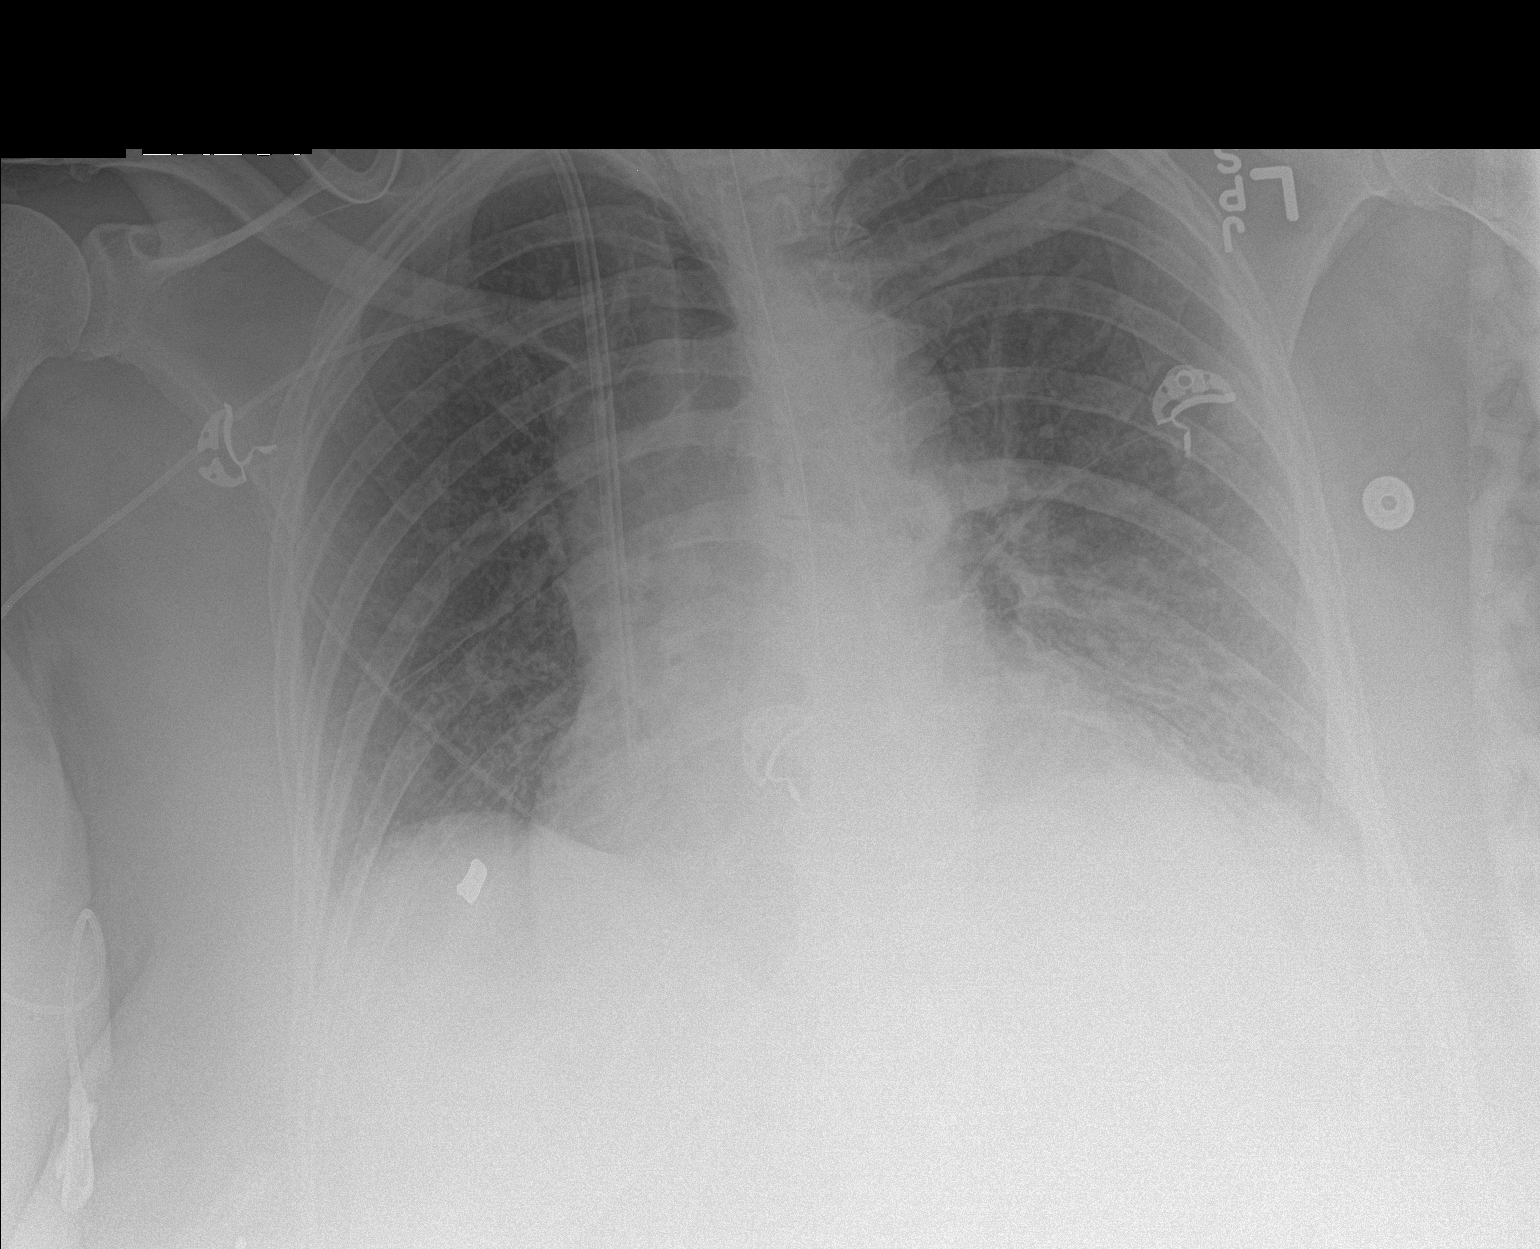

[1 of 1 positions shown; findings below may reference images not displayed]

FINDINGS: There is mild interstitial edema in the lower lung zones. No
airspace consolidation. Lungs elsewhere clear. There is cardiomegaly
with mild pulmonary venous hypertension. Central catheter tip is at
the cavoatrial junction. Nasogastric tube tip and side port are in
the stomach. No pneumothorax. No adenopathy.
IMPRESSION: Evidence of a degree of congestive heart failure. No airspace
consolidation. No pneumothorax.
